# Patient Record
Sex: Male | Born: 1995 | Race: Black or African American | Hispanic: No | Marital: Single
Health system: Southern US, Community
[De-identification: ages and names within clinical notes are randomized; demographics above are authoritative.]

---

## 2018-11-27 ENCOUNTER — Telehealth: Payer: Self-pay | Admitting: General Practice

## 2018-11-27 ENCOUNTER — Other Ambulatory Visit: Payer: Self-pay

## 2018-11-27 DIAGNOSIS — Z20822 Contact with and (suspected) exposure to covid-19: Secondary | ICD-10-CM

## 2018-11-27 NOTE — Telephone Encounter (Signed)
Call to patient- patient has been scheduled for testing and order placed. 

## 2018-11-27 NOTE — Telephone Encounter (Signed)
Dr Posey Pronto is calling from [place] @ [place]- [phone]  Is calling to get an order for COVID testing for the patient.  Symptoms include- Exposure to someone with COVID  CB- of Patient -[phone]

## 2018-12-01 LAB — NOVEL CORONAVIRUS, NAA: SARS-CoV-2, NAA: NOT DETECTED

## 2018-12-25 ENCOUNTER — Other Ambulatory Visit: Payer: Self-pay

## 2018-12-25 DIAGNOSIS — Z20822 Contact with and (suspected) exposure to covid-19: Secondary | ICD-10-CM

## 2018-12-26 LAB — NOVEL CORONAVIRUS, NAA: SARS-CoV-2, NAA: NOT DETECTED

## 2019-01-17 ENCOUNTER — Other Ambulatory Visit: Payer: Self-pay | Admitting: Family Medicine

## 2019-01-17 ENCOUNTER — Ambulatory Visit
Admission: RE | Admit: 2019-01-17 | Discharge: 2019-01-17 | Disposition: A | Discharge status: Home / Self Care | Payer: BC Managed Care – PPO | Source: Ambulatory Visit | Attending: Family Medicine | Admitting: Family Medicine

## 2019-01-17 ENCOUNTER — Other Ambulatory Visit: Payer: Self-pay

## 2019-01-17 DIAGNOSIS — R609 Edema, unspecified: Secondary | ICD-10-CM | POA: Insufficient documentation

## 2019-01-17 DIAGNOSIS — R52 Pain, unspecified: Secondary | ICD-10-CM | POA: Insufficient documentation

## 2019-01-25 ENCOUNTER — Other Ambulatory Visit: Payer: Self-pay | Admitting: Family Medicine

## 2019-01-25 DIAGNOSIS — M79661 Pain in right lower leg: Secondary | ICD-10-CM

## 2019-01-25 DIAGNOSIS — M79662 Pain in left lower leg: Secondary | ICD-10-CM

## 2019-01-26 ENCOUNTER — Other Ambulatory Visit: Payer: Self-pay

## 2019-01-26 ENCOUNTER — Ambulatory Visit
Admission: RE | Admit: 2019-01-26 | Discharge: 2019-01-26 | Disposition: A | Discharge status: Home / Self Care | Payer: BC Managed Care – PPO | Source: Ambulatory Visit | Attending: Family Medicine | Admitting: Family Medicine

## 2019-01-26 DIAGNOSIS — M79662 Pain in left lower leg: Secondary | ICD-10-CM | POA: Insufficient documentation

## 2019-01-26 DIAGNOSIS — M79661 Pain in right lower leg: Secondary | ICD-10-CM | POA: Insufficient documentation

## 2019-02-07 ENCOUNTER — Other Ambulatory Visit: Payer: Self-pay

## 2019-02-07 DIAGNOSIS — Z20822 Contact with and (suspected) exposure to covid-19: Secondary | ICD-10-CM

## 2019-02-08 LAB — NOVEL CORONAVIRUS, NAA: SARS-CoV-2, NAA: NOT DETECTED

## 2020-09-24 IMAGING — CR DG FOOT COMPLETE 3+V*L*
1 series · 3 of 3 positions shown · non-contrast
Comparison: None.

CLINICAL DATA: Pain and swelling

EXAM:
LEFT FOOT - COMPLETE 3+ VIEW

[Series 1: dg foot complete left · 0.14mm/px · 3 of 3 slices shown]
[im 1/3]
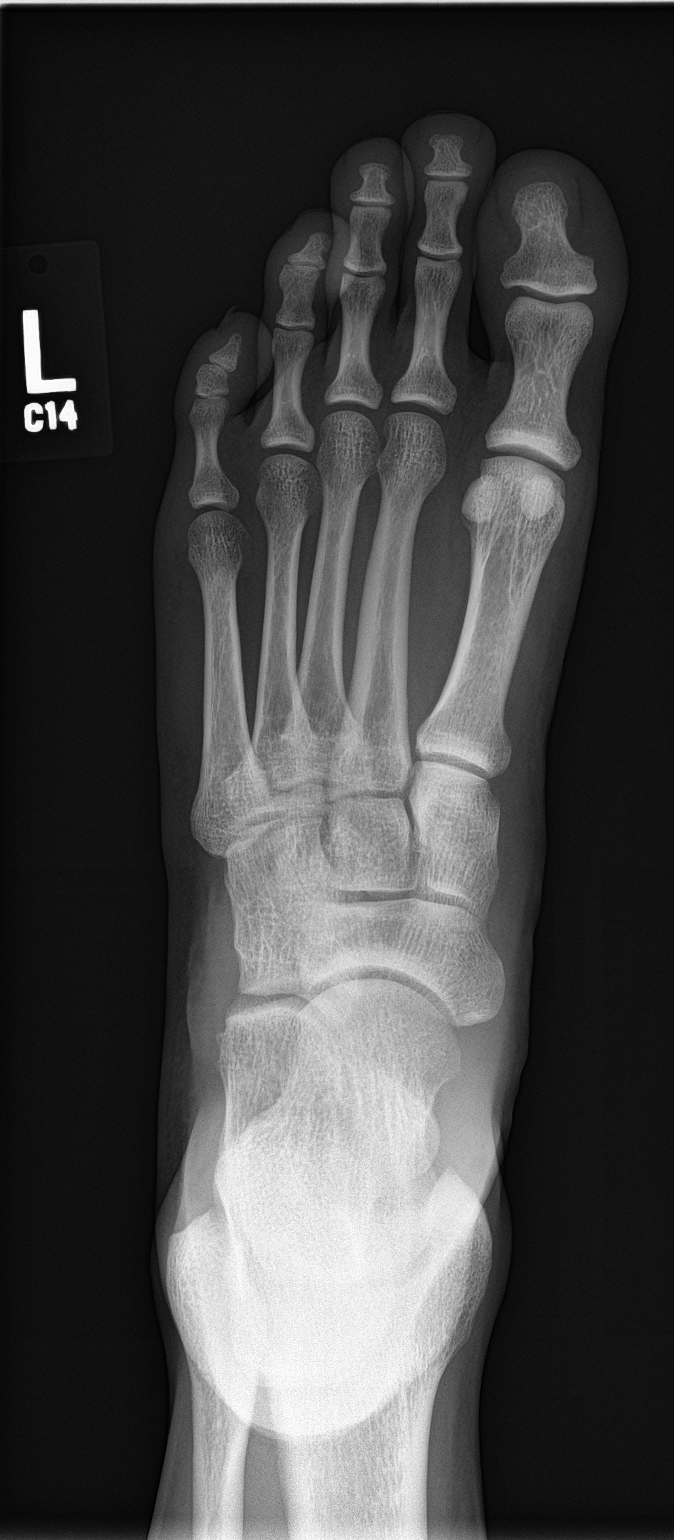
[im 2/3]
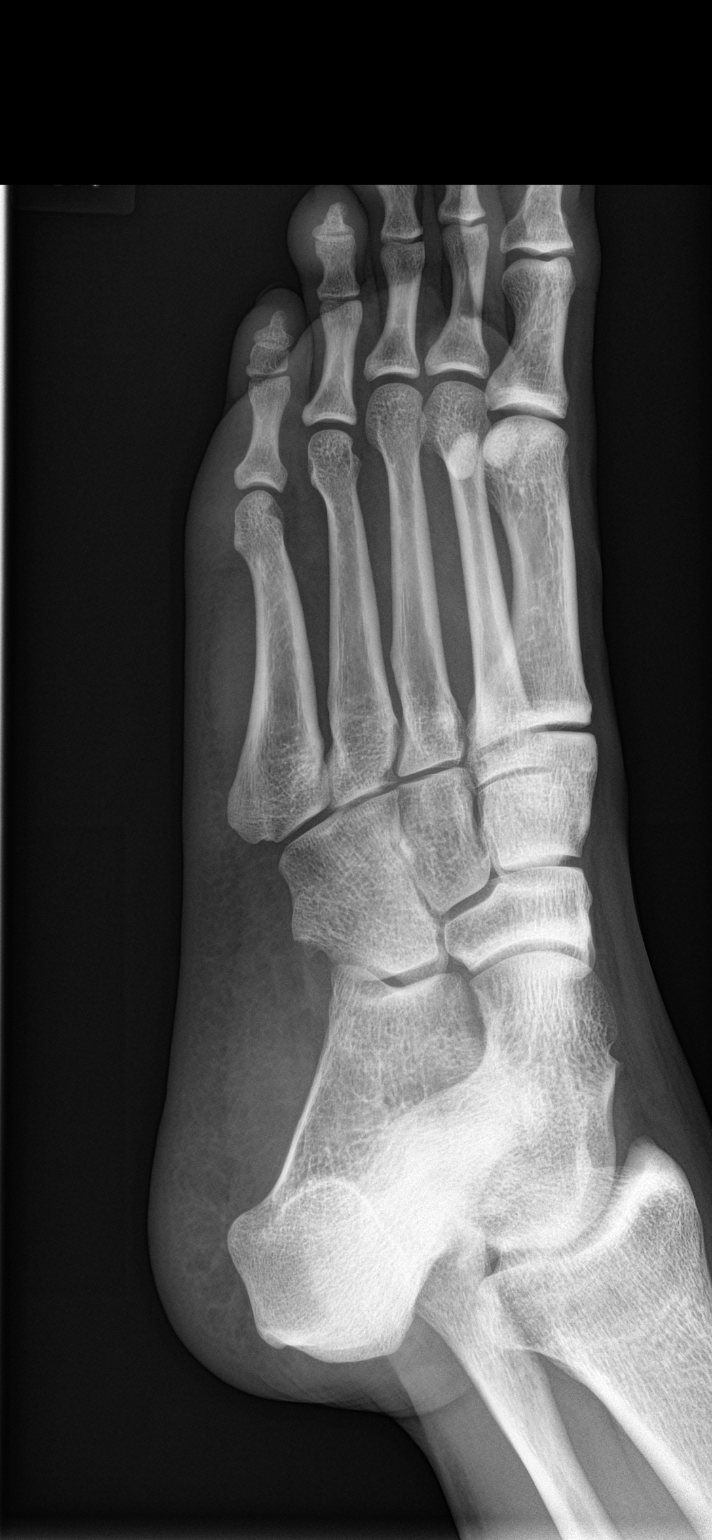
[im 3/3]
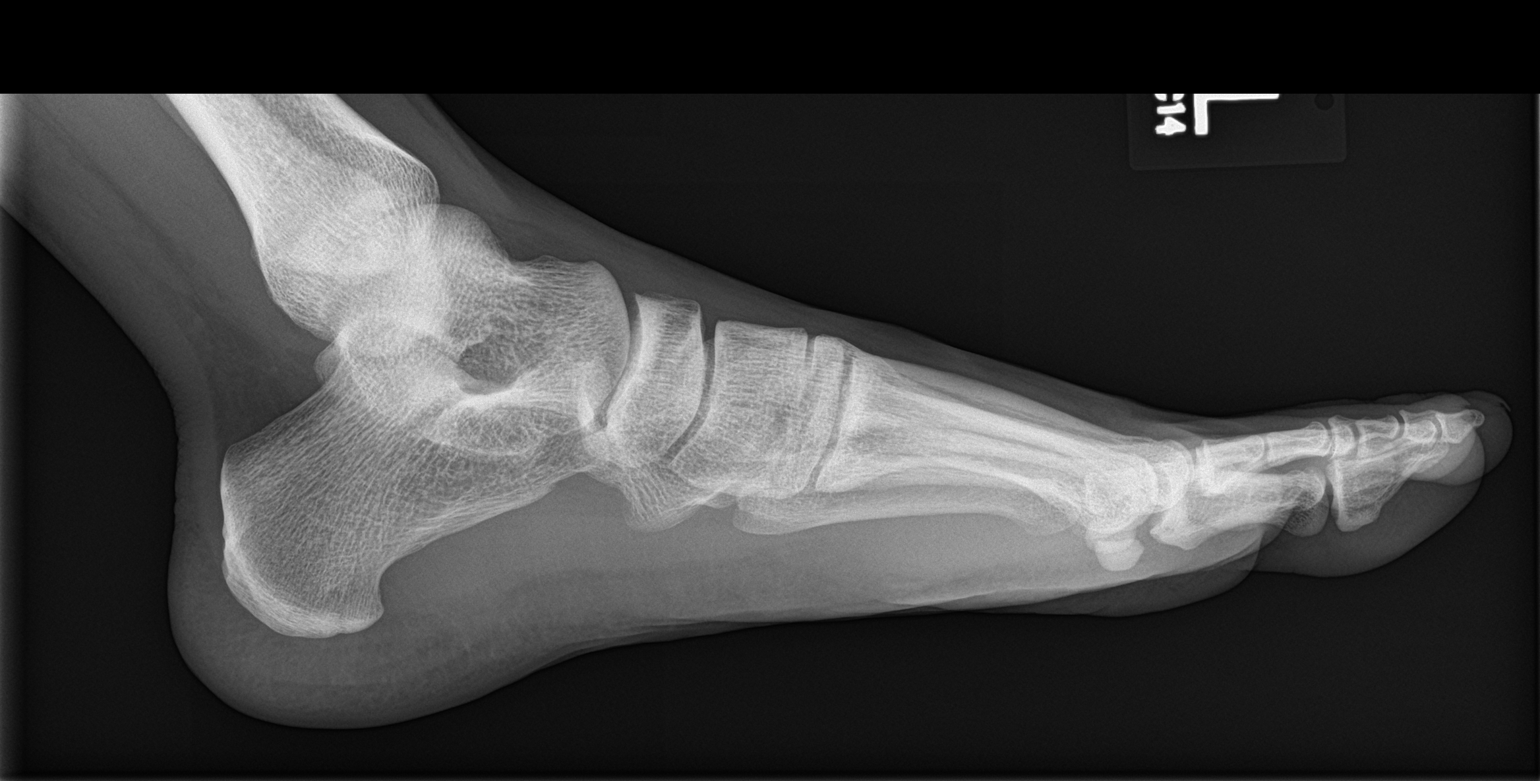

[3 of 3 positions shown; findings below may reference images not displayed]

FINDINGS: There is no evidence of fracture or dislocation. There is no
evidence of arthropathy or other focal bone abnormality. Soft
tissues are unremarkable.
IMPRESSION: Negative.

## 2020-09-24 IMAGING — CR DG FOOT COMPLETE 3+V*R*
1 series · 3 of 3 positions shown · non-contrast
Comparison: None.

CLINICAL DATA: Pain

EXAM:
RIGHT FOOT COMPLETE - 3+ VIEW

[Series 1: dg foot complete right · 0.14mm/px · 3 of 3 slices shown]
[im 1/3]
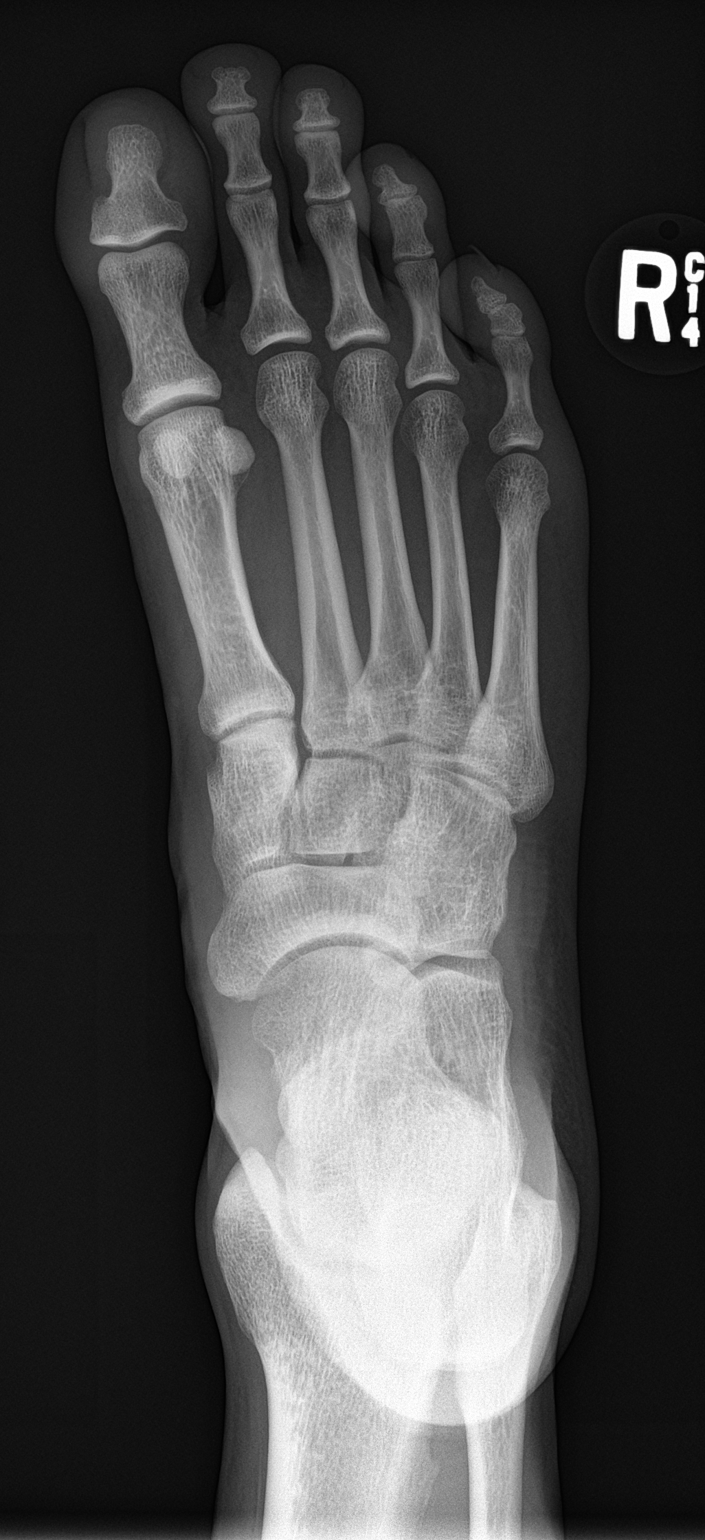
[im 2/3]
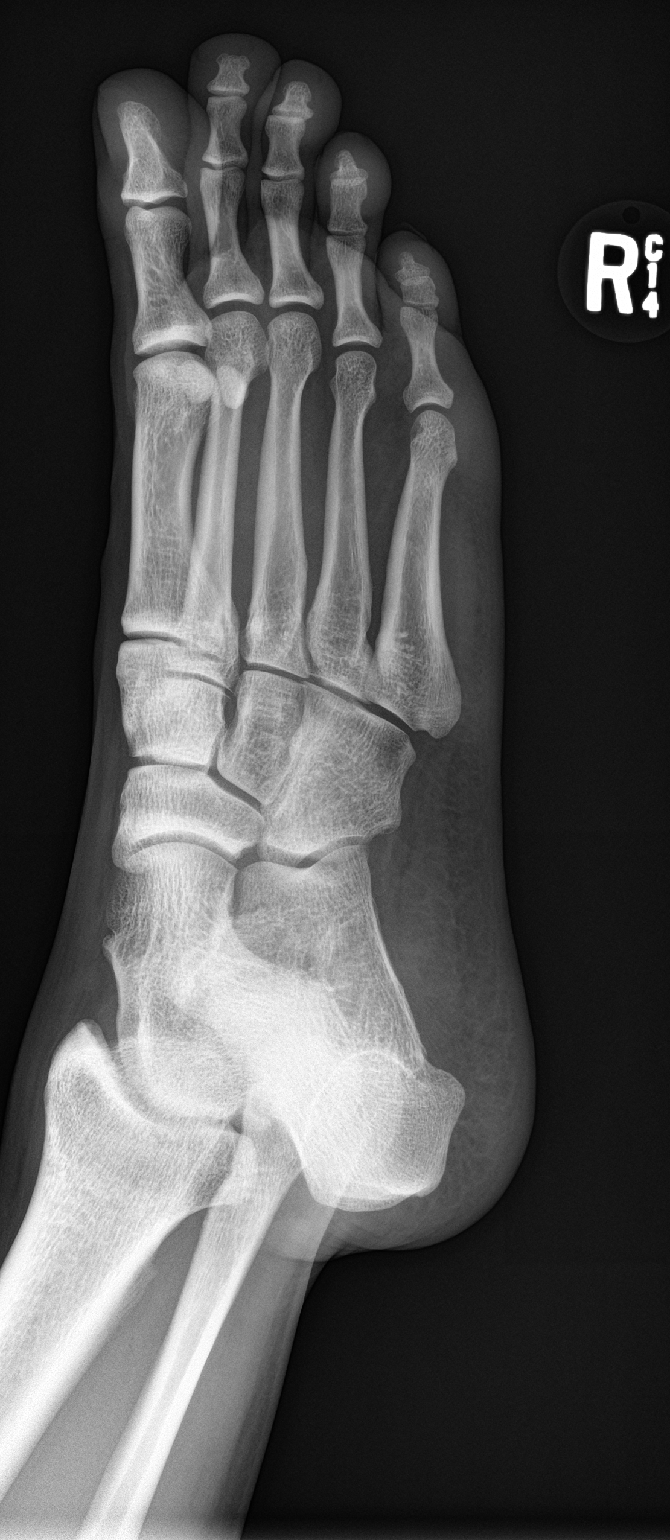
[im 3/3]
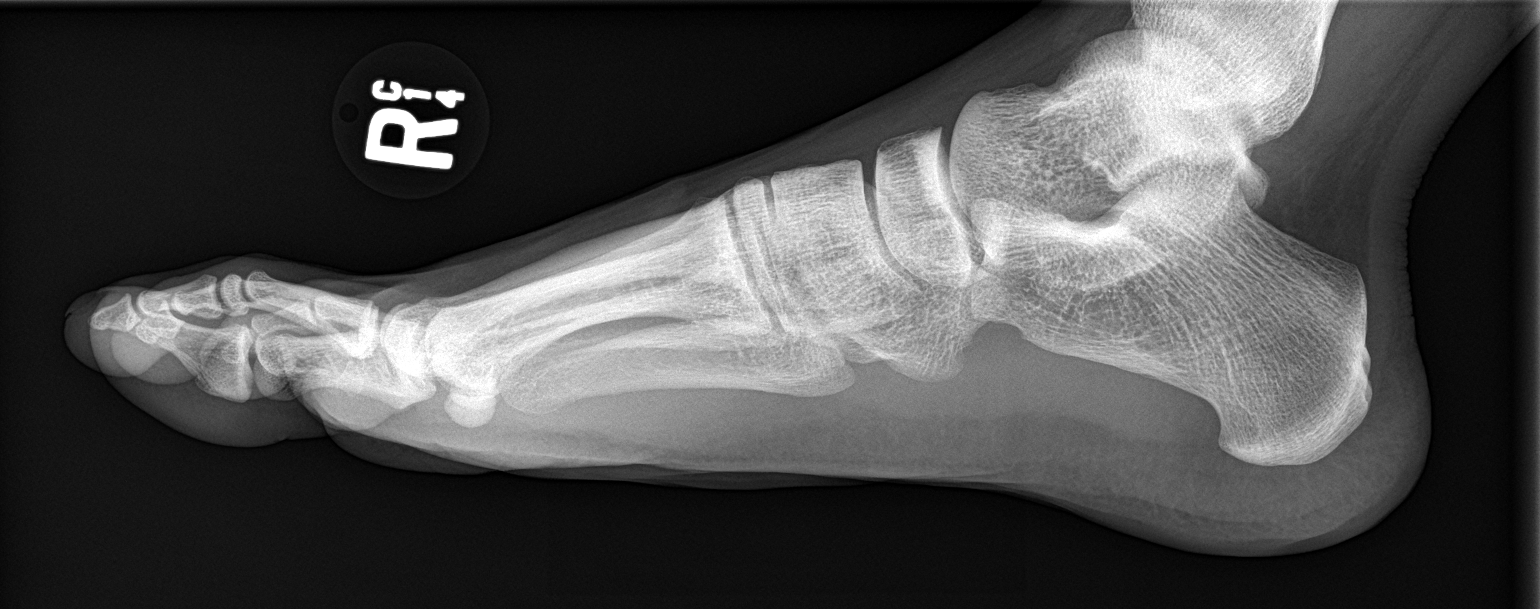

[3 of 3 positions shown; findings below may reference images not displayed]

FINDINGS: There is no evidence of fracture or dislocation. There is no
evidence of arthropathy or other focal bone abnormality. Soft
tissues are unremarkable.
IMPRESSION: Negative.

## 2020-09-24 IMAGING — CR DG ANKLE COMPLETE 3+V*R*
1 series · 3 of 3 positions shown · non-contrast
Comparison: None.

CLINICAL DATA: Pain and swelling

EXAM:
RIGHT ANKLE - COMPLETE 3+ VIEW

[Series 1: dg ankle complete right · 0.14mm/px · 3 of 3 slices shown]
[im 1/3]
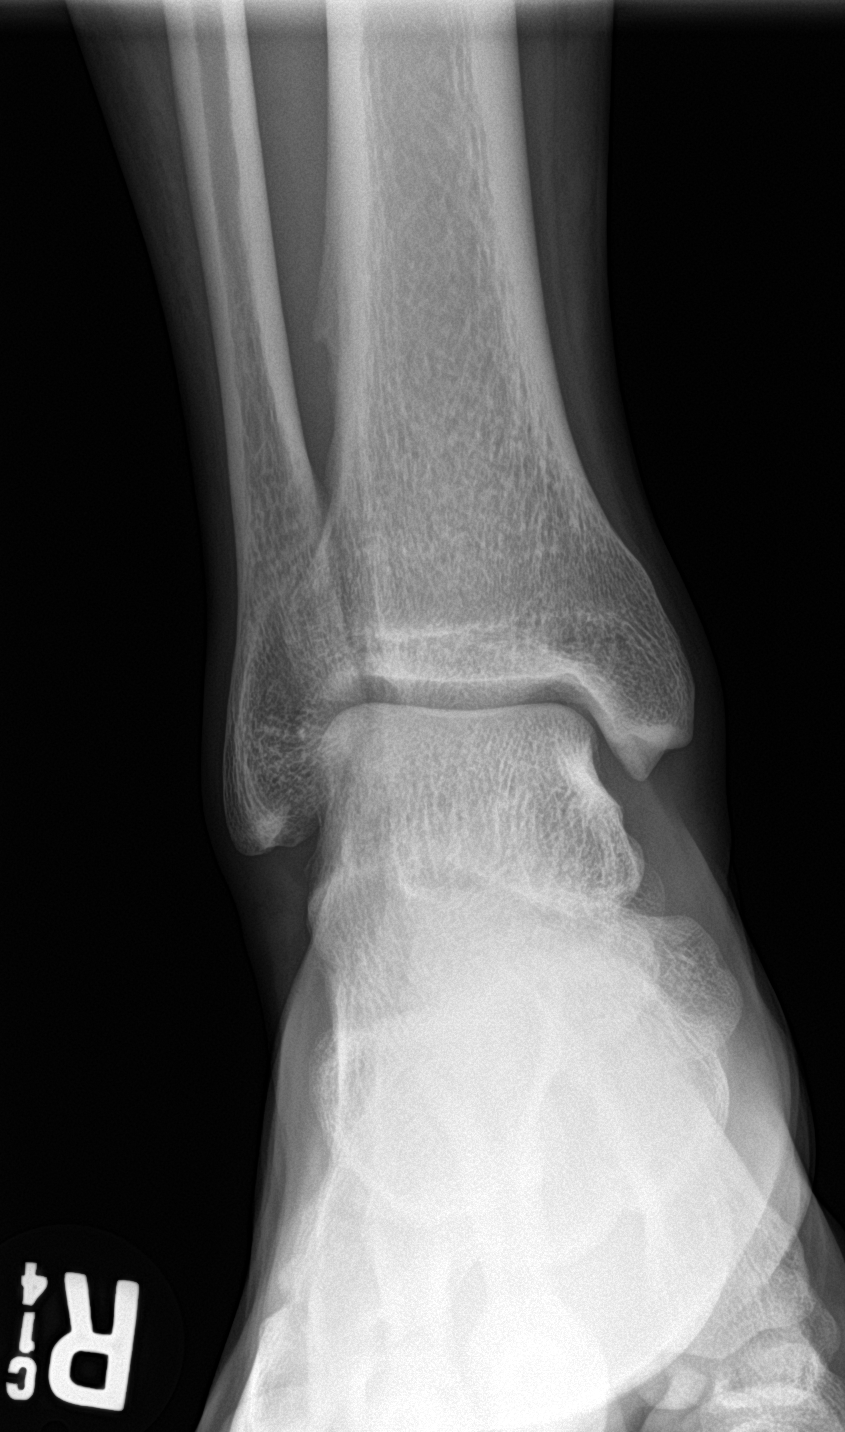
[im 2/3]
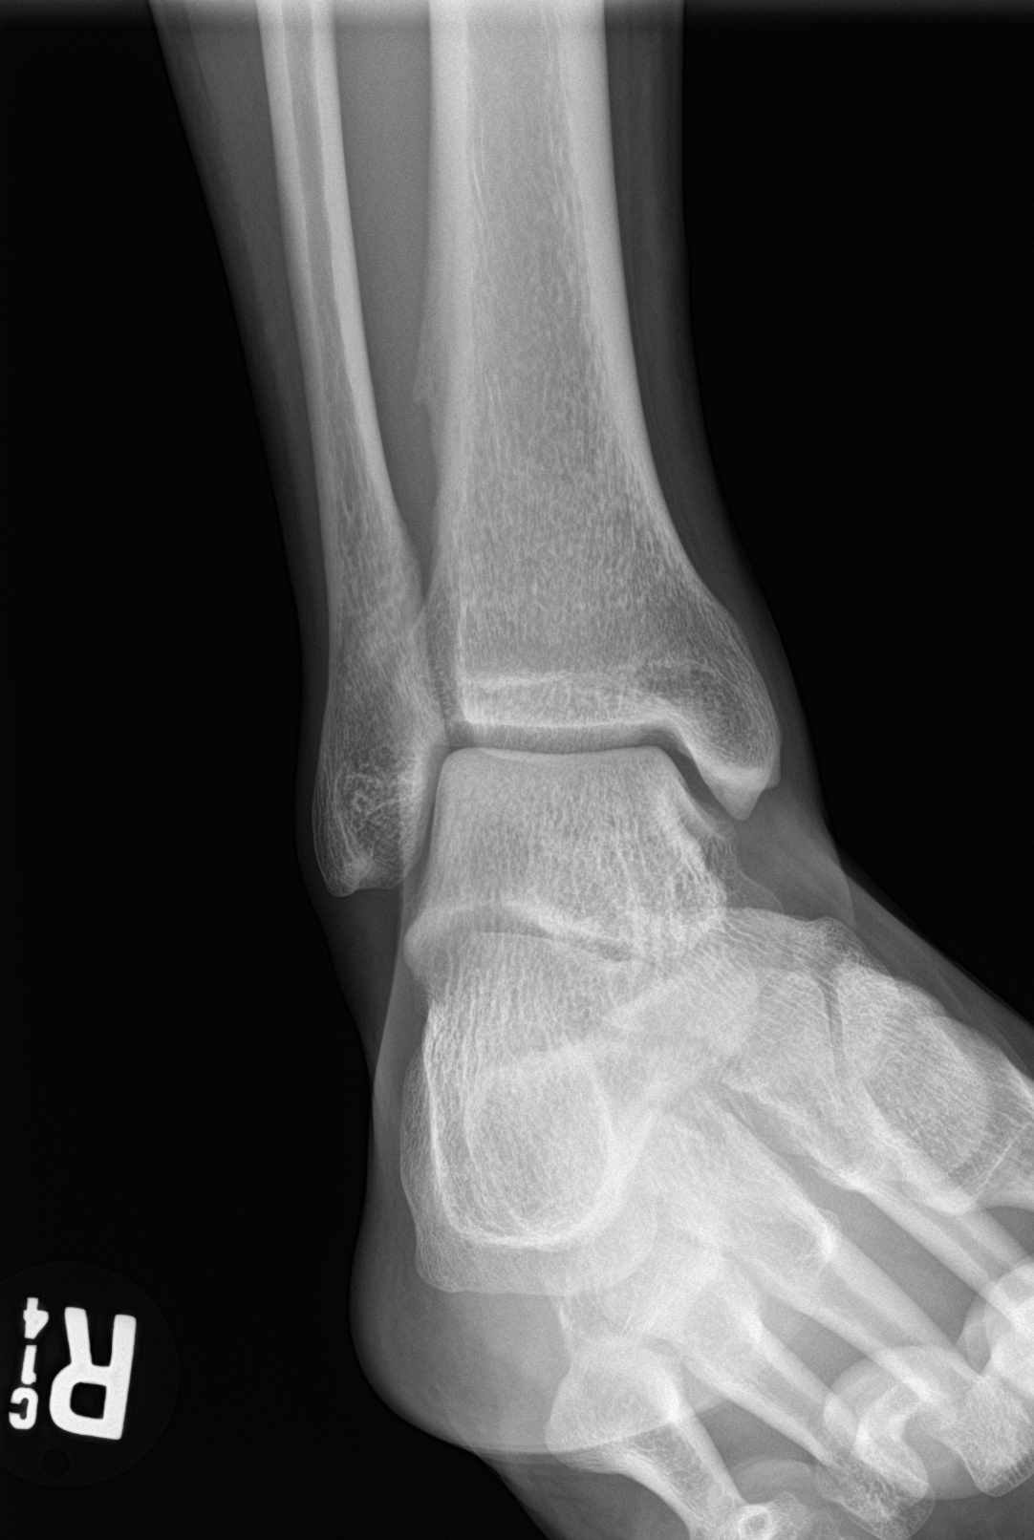
[im 3/3]
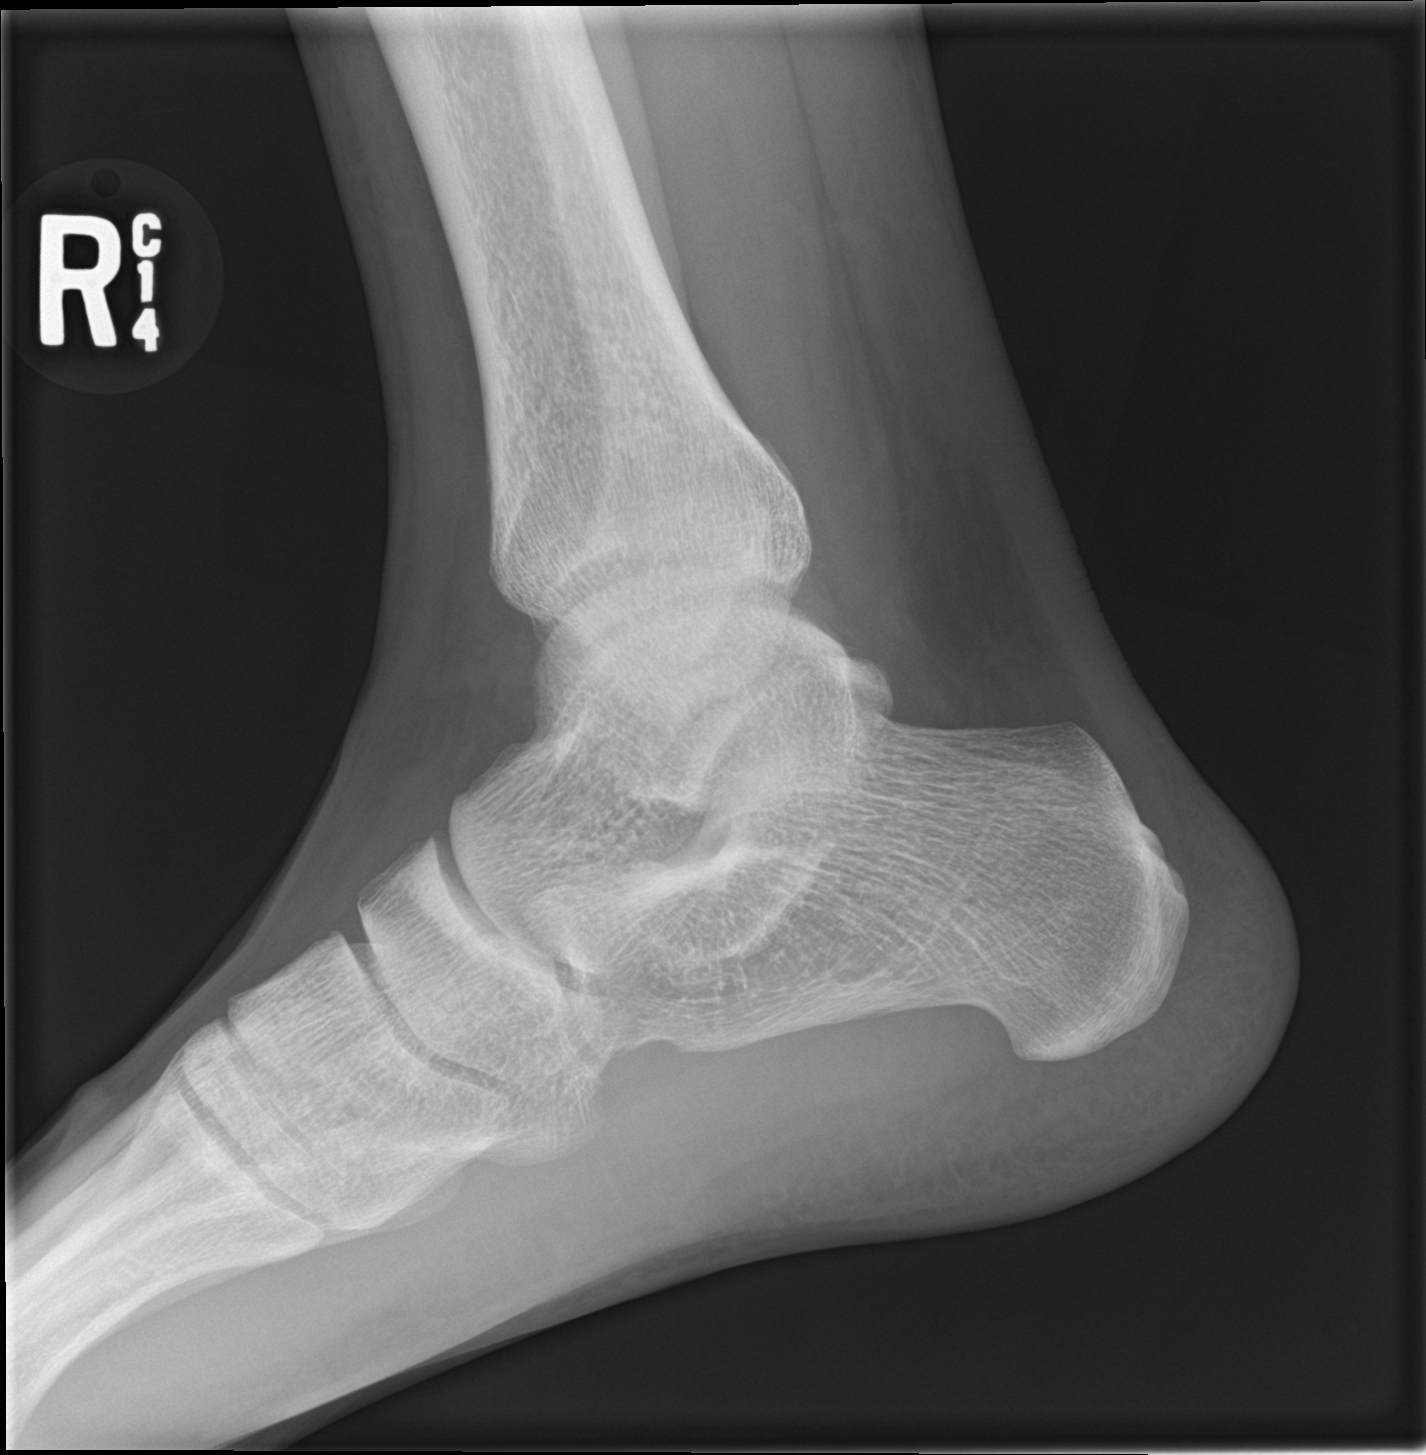

[3 of 3 positions shown; findings below may reference images not displayed]

FINDINGS: There is no evidence of fracture, dislocation, or joint effusion.
There is no evidence of arthropathy or other focal bone abnormality.
Soft tissues are unremarkable.
IMPRESSION: Negative.

## 2020-09-24 IMAGING — CR DG ANKLE COMPLETE 3+V*L*
1 series · 3 of 3 positions shown · non-contrast
Comparison: None.

CLINICAL DATA: Pain

EXAM:
LEFT ANKLE COMPLETE - 3+ VIEW

[Series 1: dg ankle complete left · 0.14mm/px · 3 of 3 slices shown]
[im 1/3]
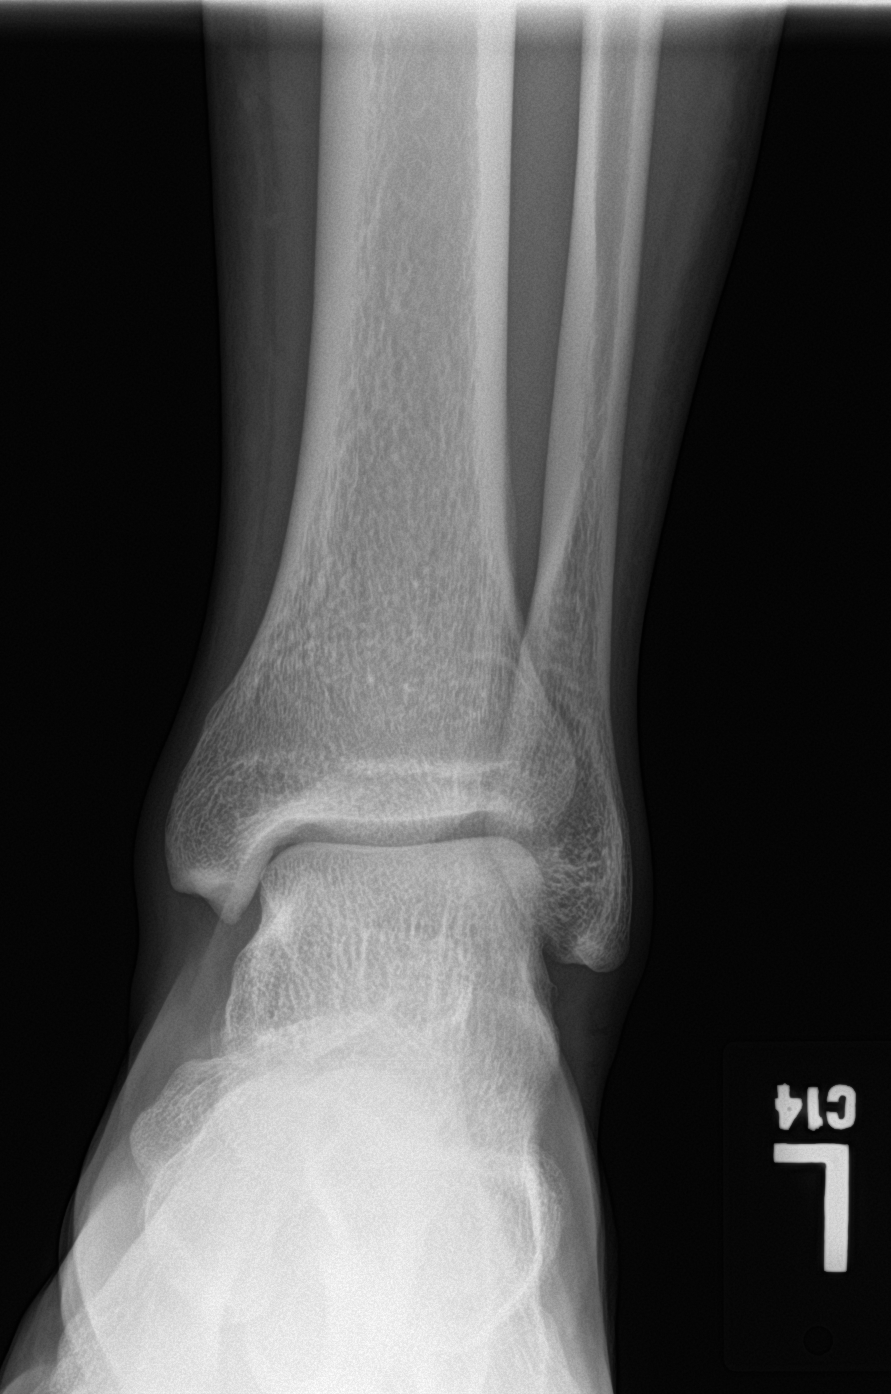
[im 2/3]
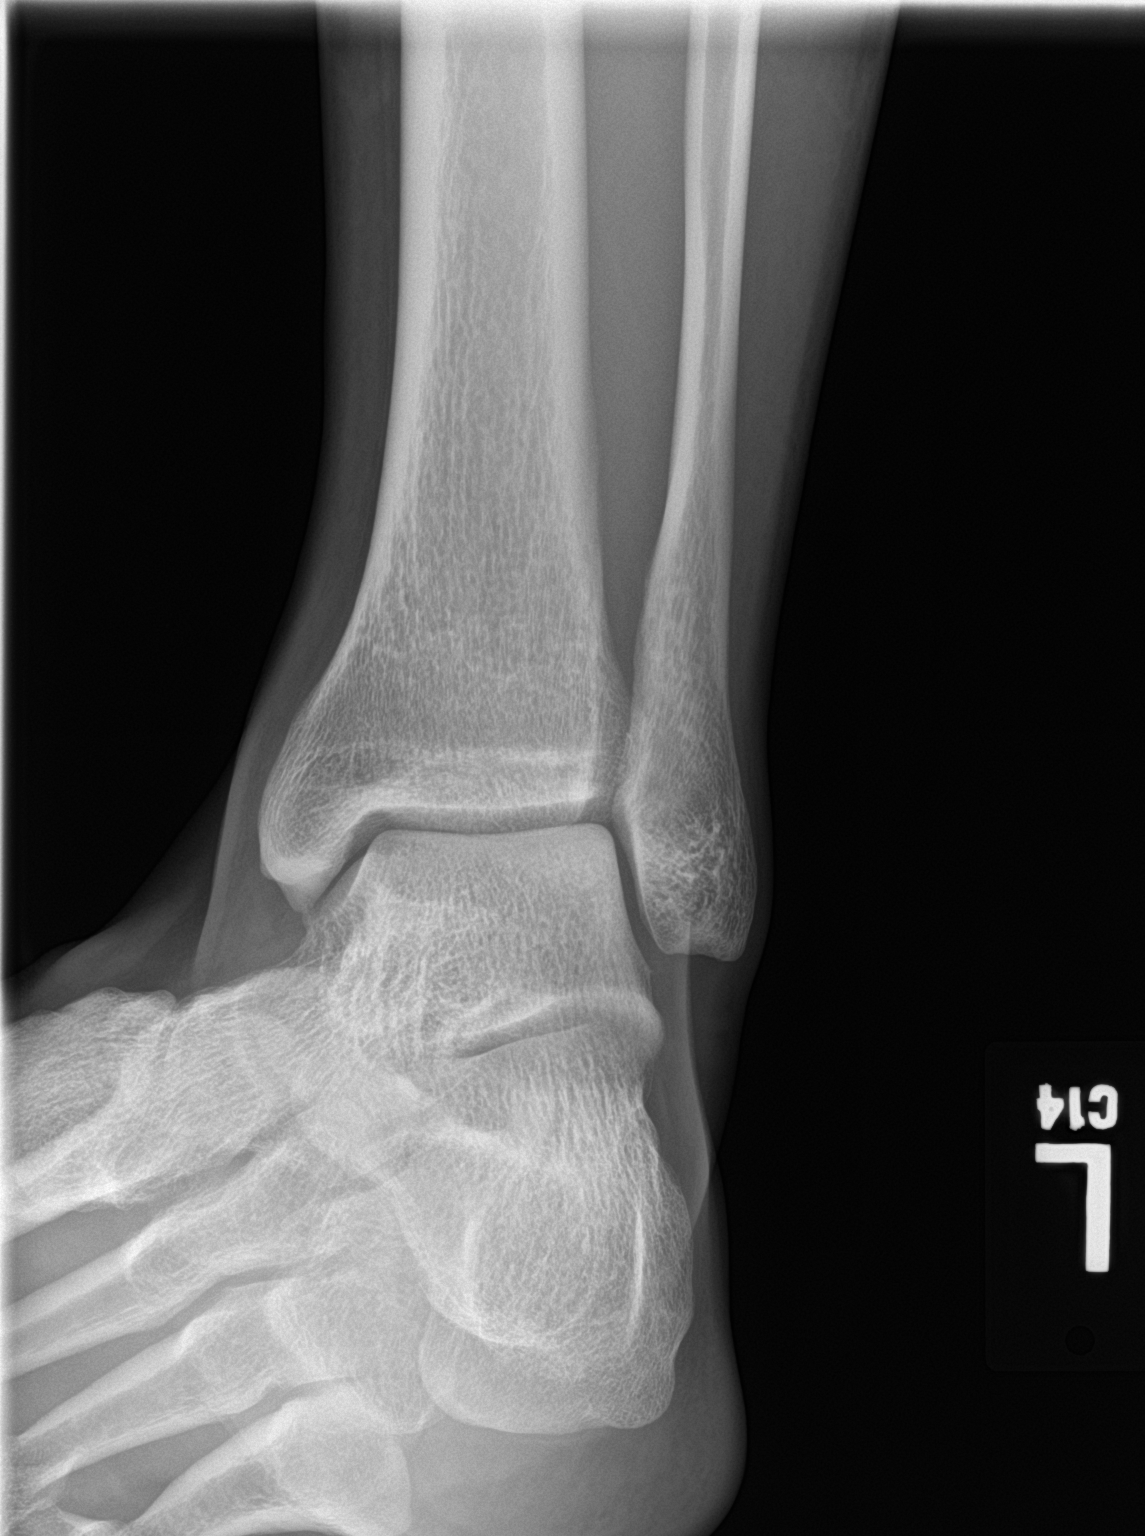
[im 3/3]
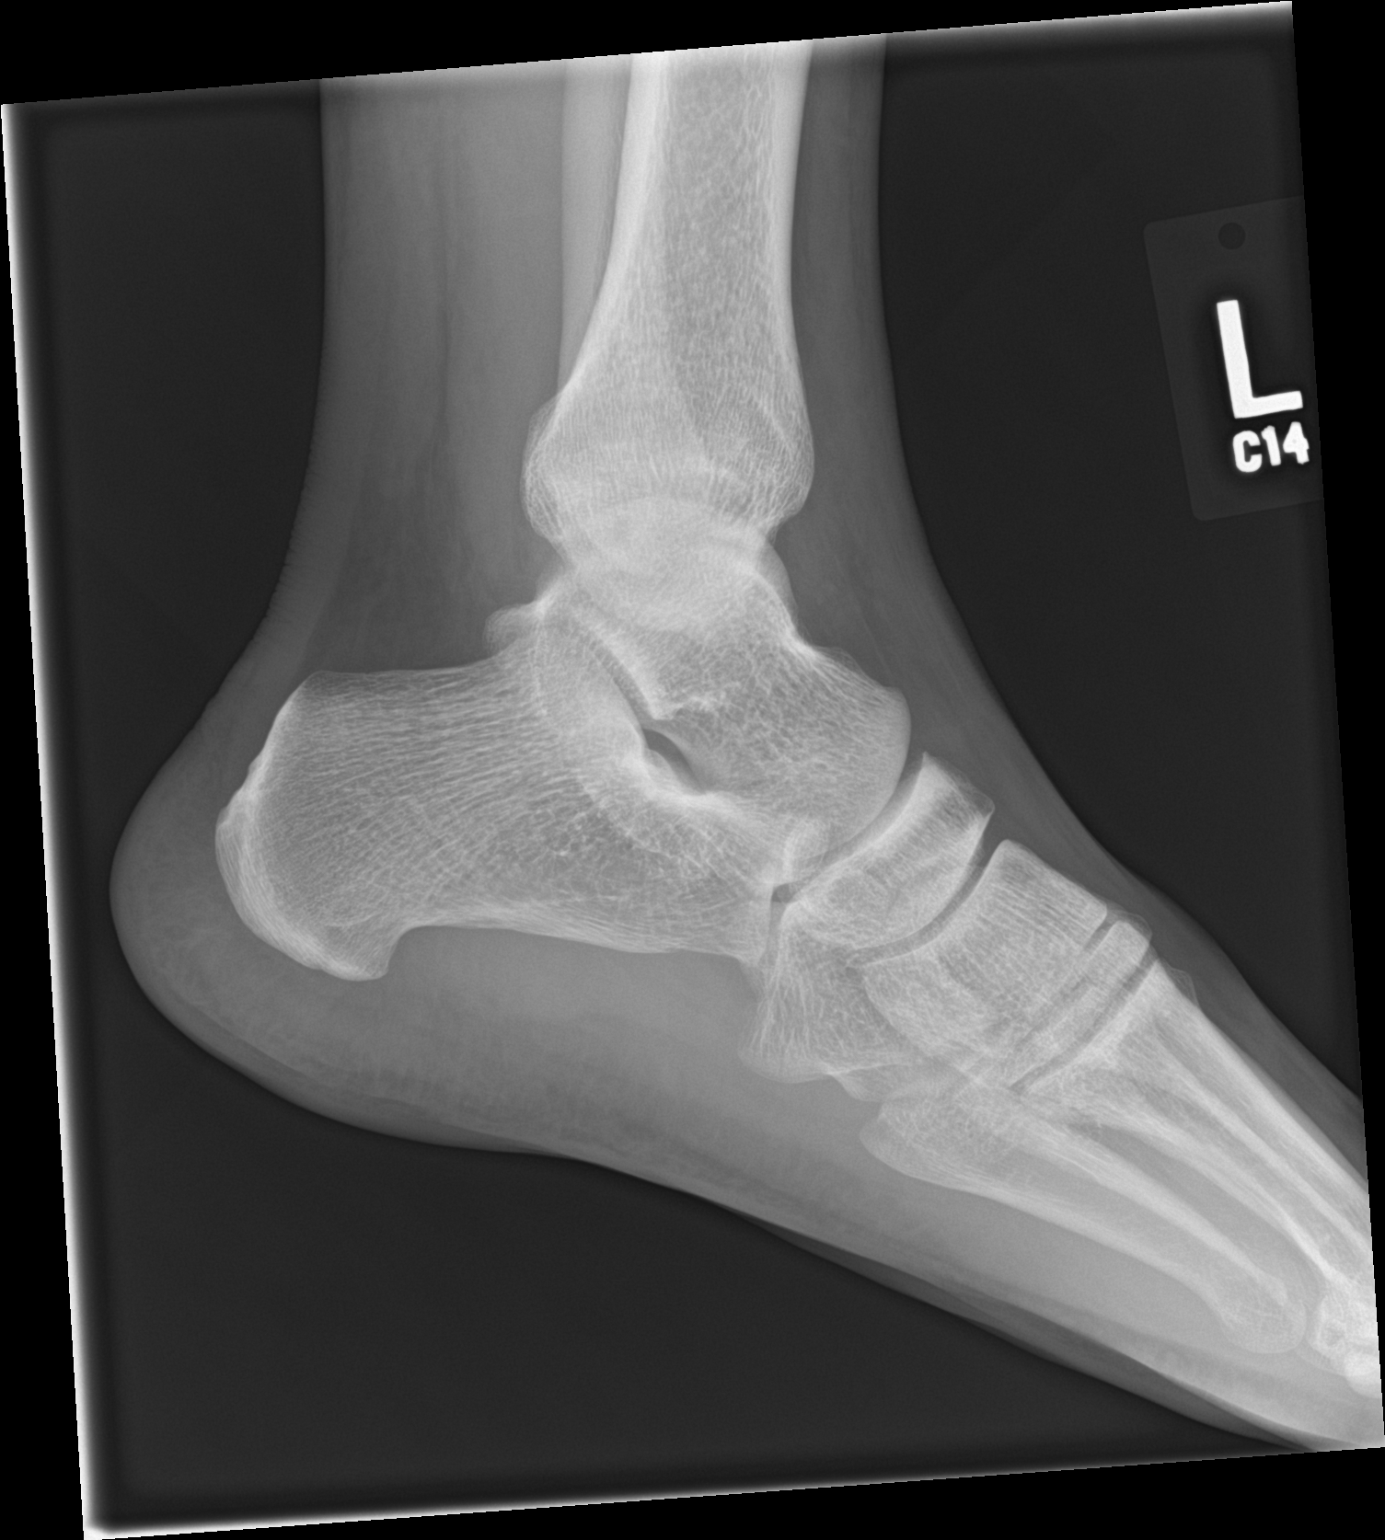

[3 of 3 positions shown; findings below may reference images not displayed]

FINDINGS: There is no evidence of fracture, dislocation, or joint effusion.
There is no evidence of arthropathy or other focal bone abnormality.
Soft tissues are unremarkable.
IMPRESSION: Negative.
